# Patient Record
Sex: Female | Born: 1950
Health system: Southern US, Community
[De-identification: ages and names within clinical notes are randomized; demographics above are authoritative.]

## PROBLEM LIST (undated history)

## (undated) DIAGNOSIS — I10 Essential (primary) hypertension: Secondary | ICD-10-CM

## (undated) DIAGNOSIS — E039 Hypothyroidism, unspecified: Secondary | ICD-10-CM

## (undated) DIAGNOSIS — M1990 Unspecified osteoarthritis, unspecified site: Secondary | ICD-10-CM

## (undated) DIAGNOSIS — K219 Gastro-esophageal reflux disease without esophagitis: Secondary | ICD-10-CM

## (undated) HISTORY — PX: OVARIAN CYST REMOVAL: SHX89

---

## 2009-12-30 ENCOUNTER — Ambulatory Visit: Payer: Self-pay | Admitting: Diagnostic Radiology

## 2009-12-30 ENCOUNTER — Emergency Department (HOSPITAL_BASED_OUTPATIENT_CLINIC_OR_DEPARTMENT_OTHER): Admission: EM | Admit: 2009-12-30 | Discharge: 2009-12-30 | Payer: Self-pay | Admitting: Emergency Medicine

## 2012-11-24 ENCOUNTER — Encounter (HOSPITAL_BASED_OUTPATIENT_CLINIC_OR_DEPARTMENT_OTHER): Payer: Self-pay | Admitting: Emergency Medicine

## 2012-11-24 ENCOUNTER — Emergency Department (HOSPITAL_BASED_OUTPATIENT_CLINIC_OR_DEPARTMENT_OTHER)
Admission: EM | Admit: 2012-11-24 | Discharge: 2012-11-24 | Disposition: A | Discharge status: Home / Self Care | Payer: BC Managed Care – PPO | Attending: Emergency Medicine | Admitting: Emergency Medicine

## 2012-11-24 DIAGNOSIS — K219 Gastro-esophageal reflux disease without esophagitis: Secondary | ICD-10-CM | POA: Insufficient documentation

## 2012-11-24 DIAGNOSIS — IMO0002 Reserved for concepts with insufficient information to code with codable children: Secondary | ICD-10-CM | POA: Insufficient documentation

## 2012-11-24 DIAGNOSIS — Y929 Unspecified place or not applicable: Secondary | ICD-10-CM | POA: Insufficient documentation

## 2012-11-24 DIAGNOSIS — Z7982 Long term (current) use of aspirin: Secondary | ICD-10-CM | POA: Insufficient documentation

## 2012-11-24 DIAGNOSIS — I10 Essential (primary) hypertension: Secondary | ICD-10-CM | POA: Insufficient documentation

## 2012-11-24 DIAGNOSIS — E039 Hypothyroidism, unspecified: Secondary | ICD-10-CM | POA: Insufficient documentation

## 2012-11-24 DIAGNOSIS — Z79899 Other long term (current) drug therapy: Secondary | ICD-10-CM | POA: Insufficient documentation

## 2012-11-24 DIAGNOSIS — Z8739 Personal history of other diseases of the musculoskeletal system and connective tissue: Secondary | ICD-10-CM | POA: Insufficient documentation

## 2012-11-24 DIAGNOSIS — T189XXA Foreign body of alimentary tract, part unspecified, initial encounter: Secondary | ICD-10-CM

## 2012-11-24 DIAGNOSIS — Y9389 Activity, other specified: Secondary | ICD-10-CM | POA: Insufficient documentation

## 2012-11-24 HISTORY — DX: Unspecified osteoarthritis, unspecified site: M19.90

## 2012-11-24 HISTORY — DX: Hypothyroidism, unspecified: E03.9

## 2012-11-24 HISTORY — DX: Gastro-esophageal reflux disease without esophagitis: K21.9

## 2012-11-24 HISTORY — DX: Essential (primary) hypertension: I10

## 2012-11-24 NOTE — ED Provider Notes (Signed)
History     CSN: [id number]  Arrival date & time 11/24/12  0955   First MD Initiated Contact with Patient 11/24/12 1039      Chief Complaint  Patient presents with  . Swallowed Foreign Body    (Consider location/radiation/quality/duration/timing/severity/associated sxs/prior treatment) Patient is a 62 y.o. female presenting with foreign body swallowed. The history is provided by the patient (The patient swallowed a wrapped calcium chew and is concerned regardign the foil wrapping.  She has no other complaints. ). No language interpreter was used.  Swallowed Foreign Body This is a new problem. The current episode started less than 1 hour ago. The problem occurs constantly. The problem has not changed since onset.Pertinent negatives include no chest pain, no abdominal pain and no shortness of breath. Nothing aggravates the symptoms. Nothing relieves the symptoms. She has tried nothing for the symptoms. The treatment provided no relief.    Past Medical History  Diagnosis Date  . Hypertension   . Arthritis   . GERD (gastroesophageal reflux disease)   . Hypothyroid     No past surgical history on file.  No family history on file.  History  Substance Use Topics  . Smoking status: Not on file  . Smokeless tobacco: Not on file  . Alcohol Use: Not on file    OB History   Grav Para Term Preterm Abortions TAB SAB Ect Mult Living                  Review of Systems  Respiratory: Negative for shortness of breath.   Cardiovascular: Negative for chest pain.  Gastrointestinal: Negative for abdominal pain.  All other systems reviewed and are negative.    Allergies  Caine-1 and Penicillins  Home Medications   Current Outpatient Rx  Name  Route  Sig  Dispense  Refill  . aspirin 81 MG tablet   Oral   Take 81 mg by mouth daily.         . Calcium-Vitamin D-Vitamin K (VIACTIV) 500-500-40 MG-UNT-MCG CHEW   Oral   Chew by mouth.         Marland Kitchen ibuprofen (ADVIL,MOTRIN) 800 MG  tablet   Oral   Take 800 mg by mouth every 8 (eight) hours as needed for pain.         Marland Kitchen levothyroxine (SYNTHROID, LEVOTHROID) 100 MCG tablet   Oral   Take 100 mcg by mouth daily before breakfast.         . lisinopril (PRINIVIL,ZESTRIL) 10 MG tablet   Oral   Take 10 mg by mouth daily.         Marland Kitchen omeprazole (PRILOSEC) 10 MG capsule   Oral   Take 10 mg by mouth daily.         Marland Kitchen triamterene-hydrochlorothiazide (MAXZIDE-25) 37.5-25 MG per tablet   Oral   Take 1 tablet by mouth daily.           BP 136/83  Pulse 78  Temp(Src) 97.7 F (36.5 C) (Oral)  Resp 16  Ht 5\' 8"  (1.727 m)  Wt 258 lb (117.028 kg)  BMI 39.24 kg/m2  SpO2 99%  Physical Exam  Nursing note and vitals reviewed. Constitutional: She is oriented to person, place, and time. She appears well-developed and well-nourished.  HENT:  Head: Normocephalic.  Mouth/Throat: Oropharynx is clear and moist.  Neck: Normal range of motion. Neck supple.  Cardiovascular: Normal rate.   Pulmonary/Chest: Effort normal and breath sounds normal.  Abdominal: Soft.  Neurological: She is  alert and oriented to person, place, and time.  Skin: Skin is warm and dry.  Psychiatric: She has a normal mood and affect.    ED Course  Procedures (including critical care time)  Labs Reviewed - No data to display No results found.   No diagnosis found.    MDM  Patient with swallowed fb without concern for esophageal obstruction.  Patient advised return precautions.         Hilario Quarry, MD 11/24/12 1046

## 2012-11-24 NOTE — ED Notes (Signed)
Pt accidentally swallowed a viactiv supplement with the wrapper intact.  Denies any pain or nausea currently.

## 2012-11-24 NOTE — ED Notes (Signed)
Called poison control regarding ingestion of viactiv.  Poison control stated that there is no concern regarding the wrapper and that the patient would digest the paper and expell it through her stool.

## 2013-04-21 ENCOUNTER — Encounter (HOSPITAL_BASED_OUTPATIENT_CLINIC_OR_DEPARTMENT_OTHER): Payer: Self-pay

## 2013-04-21 ENCOUNTER — Emergency Department (HOSPITAL_BASED_OUTPATIENT_CLINIC_OR_DEPARTMENT_OTHER)
Admission: EM | Admit: 2013-04-21 | Discharge: 2013-04-21 | Disposition: A | Discharge status: Home / Self Care | Payer: BC Managed Care – PPO | Attending: Emergency Medicine | Admitting: Emergency Medicine

## 2013-04-21 DIAGNOSIS — S61219A Laceration without foreign body of unspecified finger without damage to nail, initial encounter: Secondary | ICD-10-CM

## 2013-04-21 DIAGNOSIS — M129 Arthropathy, unspecified: Secondary | ICD-10-CM | POA: Insufficient documentation

## 2013-04-21 DIAGNOSIS — Z880 Allergy status to penicillin: Secondary | ICD-10-CM | POA: Insufficient documentation

## 2013-04-21 DIAGNOSIS — I10 Essential (primary) hypertension: Secondary | ICD-10-CM | POA: Insufficient documentation

## 2013-04-21 DIAGNOSIS — E039 Hypothyroidism, unspecified: Secondary | ICD-10-CM | POA: Insufficient documentation

## 2013-04-21 DIAGNOSIS — Y9389 Activity, other specified: Secondary | ICD-10-CM | POA: Insufficient documentation

## 2013-04-21 DIAGNOSIS — Y929 Unspecified place or not applicable: Secondary | ICD-10-CM | POA: Insufficient documentation

## 2013-04-21 DIAGNOSIS — K219 Gastro-esophageal reflux disease without esophagitis: Secondary | ICD-10-CM | POA: Insufficient documentation

## 2013-04-21 DIAGNOSIS — W269XXA Contact with unspecified sharp object(s), initial encounter: Secondary | ICD-10-CM | POA: Insufficient documentation

## 2013-04-21 DIAGNOSIS — S61209A Unspecified open wound of unspecified finger without damage to nail, initial encounter: Secondary | ICD-10-CM | POA: Insufficient documentation

## 2013-04-21 DIAGNOSIS — Z7982 Long term (current) use of aspirin: Secondary | ICD-10-CM | POA: Insufficient documentation

## 2013-04-21 DIAGNOSIS — Z79899 Other long term (current) drug therapy: Secondary | ICD-10-CM | POA: Insufficient documentation

## 2013-04-21 NOTE — ED Notes (Signed)
Laceration to right index finger

## 2013-04-21 NOTE — ED Provider Notes (Signed)
CSN: [id number]     Arrival date & time 04/21/13  1747 History  This chart was scribed for Hilario Quarry, MD by Quintella Reichert, ED scribe.  This patient was seen in room MH04/MH04 and the patient's care was started at 7:50 PM.   Chief Complaint  Patient presents with  . Extremity Laceration    The history is provided by the patient. No language interpreter was used.    HPI Comments: Kaitlin Nolan is a 62 y.o. female with h/o HTN, hypothyroidism and arthritis who presents to the Emergency Department complaining of a laceration to her right index finger sustained 2 hours ago.  Pt states she was reaching into a section of her car that she couldn't see when her finger was cut on something sharp.  She does not know what the object was.  The area began bleeding immediately.  She did not clean the wound.  She has been keeping the area wrapped.  Last tetanus vaccination was within the past year.  She denies h/o DM.  She medicates regularly with Synthroid, Maxzide, lisinopril, aspirin, and ibuprofen.  PCP is Dr. Gildardo Griffes at [facility]   Past Medical History  Diagnosis Date  . Hypertension   . Arthritis   . GERD (gastroesophageal reflux disease)   . Hypothyroid     History reviewed. No pertinent past surgical history.   No family history on file.    History  Substance Use Topics  . Smoking status: Never Smoker   . Smokeless tobacco: Not on file  . Alcohol Use: No   OB History   Grav Para Term Preterm Abortions TAB SAB Ect Mult Living                  Review of Systems  Skin: Positive for wound.  All other systems reviewed and are negative.     Allergies  Caine-1 and Penicillins  Home Medications   Current Outpatient Rx  Name  Route  Sig  Dispense  Refill  . aspirin 81 MG tablet   Oral   Take 81 mg by mouth daily.         . Calcium-Vitamin D-Vitamin K (VIACTIV) 500-500-40 MG-UNT-MCG CHEW   Oral   Chew by mouth.         Marland Kitchen ibuprofen (ADVIL,MOTRIN)  800 MG tablet   Oral   Take 800 mg by mouth every 8 (eight) hours as needed for pain.         Marland Kitchen levothyroxine (SYNTHROID, LEVOTHROID) 100 MCG tablet   Oral   Take 100 mcg by mouth daily before breakfast.         . lisinopril (PRINIVIL,ZESTRIL) 10 MG tablet   Oral   Take 10 mg by mouth daily.         Marland Kitchen omeprazole (PRILOSEC) 10 MG capsule   Oral   Take 10 mg by mouth daily.         Marland Kitchen triamterene-hydrochlorothiazide (MAXZIDE-25) 37.5-25 MG per tablet   Oral   Take 1 tablet by mouth daily.          BP 150/82  Pulse 83  Temp(Src) 98.4 F (36.9 C) (Oral)  Resp 18  Ht 5\' 6"  (1.676 m)  Wt 260 lb (117.935 kg)  BMI 41.99 kg/m2  SpO2 100%  Physical Exam  Nursing note and vitals reviewed. Constitutional: She is oriented to person, place, and time. She appears well-developed and well-nourished. No distress.  HENT:  Head: Normocephalic and atraumatic.  Eyes: EOM  are normal.  Neck: Neck supple. No tracheal deviation present.  Cardiovascular: Normal rate.   Pulmonary/Chest: Effort normal. No respiratory distress.  Musculoskeletal: Normal range of motion.  Full active ROM to right index finger  Neurological: She is alert and oriented to person, place, and time.  Full 2-point discrimination to right index finger distal to injury  Skin: Skin is warm and dry.  2-cm laceration to palmar aspect of right index finger, distal to PIP joint.  When visually examined after anesthetized appeared to involve only subcutaneous fat, no apparent tendon involvement, no foreign bodies  Psychiatric: She has a normal mood and affect. Her behavior is normal.    ED Course  Procedures (including critical care time)  DIAGNOSTIC STUDIES: Oxygen Saturation is 100% on room air, normal by my interpretation.    COORDINATION OF CARE: 7:53 PM-Discussed treatment plan which includes laceration repair with pt at bedside and pt agreed to plan.   LACERATION REPAIR Performed by: Hilario Quarry,  MD Consent: Verbal consent obtained. Risks and benefits: risks, benefits and alternatives were discussed Patient identity confirmed: provided demographic data Time out performed prior to procedure Prepped and Draped in normal sterile fashion Wound explored Laceration Location: palmar aspect of right index finger Laceration Length: 2 cm No Foreign Bodies seen or palpated Anesthesia: local infiltration Local anesthetic: lidocaine 1% with epinephrine Anesthetic total: 2 ml Irrigation method: syringe Amount of cleaning: standard Number and type of sutures: 4, 4-0 prolene Technique: simple interrupted Tetanus UTD Patient tolerance: Patient tolerated the procedure well with no immediate complications.   Labs Review Labs Reviewed - No data to display  Imaging Review No results found.  MDM  I personally performed the services described in this documentation, which was scribed in my presence. The recorded information has been reviewed and considered.    Hilario Quarry, MD 04/21/13 [phone]

## 2013-04-21 NOTE — ED Notes (Signed)
MD at bedside. 

## 2013-04-21 NOTE — ED Notes (Signed)
Wound care provided, pt educated on s/s infection, bacitracin, gauze and conform applied.

## 2014-11-11 ENCOUNTER — Emergency Department (HOSPITAL_BASED_OUTPATIENT_CLINIC_OR_DEPARTMENT_OTHER)
Admission: EM | Admit: 2014-11-11 | Discharge: 2014-11-11 | Disposition: A | Discharge status: Home / Self Care | Payer: Medicare Other | Attending: Emergency Medicine | Admitting: Emergency Medicine

## 2014-11-11 ENCOUNTER — Emergency Department (HOSPITAL_BASED_OUTPATIENT_CLINIC_OR_DEPARTMENT_OTHER): Payer: Medicare Other

## 2014-11-11 ENCOUNTER — Other Ambulatory Visit: Payer: Self-pay

## 2014-11-11 ENCOUNTER — Encounter (HOSPITAL_BASED_OUTPATIENT_CLINIC_OR_DEPARTMENT_OTHER): Payer: Self-pay

## 2014-11-11 DIAGNOSIS — E039 Hypothyroidism, unspecified: Secondary | ICD-10-CM | POA: Diagnosis not present

## 2014-11-11 DIAGNOSIS — Z7982 Long term (current) use of aspirin: Secondary | ICD-10-CM | POA: Diagnosis not present

## 2014-11-11 DIAGNOSIS — R002 Palpitations: Secondary | ICD-10-CM | POA: Diagnosis present

## 2014-11-11 DIAGNOSIS — Z79899 Other long term (current) drug therapy: Secondary | ICD-10-CM | POA: Diagnosis not present

## 2014-11-11 DIAGNOSIS — Z9889 Other specified postprocedural states: Secondary | ICD-10-CM | POA: Insufficient documentation

## 2014-11-11 DIAGNOSIS — R079 Chest pain, unspecified: Secondary | ICD-10-CM | POA: Diagnosis not present

## 2014-11-11 DIAGNOSIS — K219 Gastro-esophageal reflux disease without esophagitis: Secondary | ICD-10-CM | POA: Insufficient documentation

## 2014-11-11 DIAGNOSIS — I10 Essential (primary) hypertension: Secondary | ICD-10-CM | POA: Insufficient documentation

## 2014-11-11 DIAGNOSIS — M1990 Unspecified osteoarthritis, unspecified site: Secondary | ICD-10-CM | POA: Diagnosis not present

## 2014-11-11 LAB — CBC WITH DIFFERENTIAL/PLATELET
Basophils Absolute: 0 10*3/uL (ref 0.0–0.1)
Basophils Relative: 0 % (ref 0–1)
EOS PCT: 1 % (ref 0–5)
Eosinophils Absolute: 0.1 10*3/uL (ref 0.0–0.7)
HCT: 39.5 % (ref 36.0–46.0)
HEMOGLOBIN: 12.7 g/dL (ref 12.0–15.0)
LYMPHS ABS: 2.1 10*3/uL (ref 0.7–4.0)
LYMPHS PCT: 24 % (ref 12–46)
MCH: 29.3 pg (ref 26.0–34.0)
MCHC: 32.2 g/dL (ref 30.0–36.0)
MCV: 91.2 fL (ref 78.0–100.0)
MONO ABS: 0.4 10*3/uL (ref 0.1–1.0)
MONOS PCT: 5 % (ref 3–12)
Neutro Abs: 6 10*3/uL (ref 1.7–7.7)
Neutrophils Relative %: 70 % (ref 43–77)
Platelets: 214 10*3/uL (ref 150–400)
RBC: 4.33 MIL/uL (ref 3.87–5.11)
RDW: 13.9 % (ref 11.5–15.5)
WBC: 8.6 10*3/uL (ref 4.0–10.5)

## 2014-11-11 LAB — COMPREHENSIVE METABOLIC PANEL
ALK PHOS: 40 U/L (ref 39–117)
ALT: 24 U/L (ref 0–35)
AST: 21 U/L (ref 0–37)
Albumin: 4.2 g/dL (ref 3.5–5.2)
Anion gap: 7 (ref 5–15)
BUN: 19 mg/dL (ref 6–23)
CALCIUM: 9.4 mg/dL (ref 8.4–10.5)
CO2: 31 mmol/L (ref 19–32)
Chloride: 100 mmol/L (ref 96–112)
Creatinine, Ser: 0.93 mg/dL (ref 0.50–1.10)
GFR, EST AFRICAN AMERICAN: 73 mL/min — AB (ref >90)
GFR, EST NON AFRICAN AMERICAN: 63 mL/min — AB (ref >90)
GLUCOSE: 124 mg/dL — AB (ref 70–99)
Potassium: 3.7 mmol/L (ref 3.5–5.1)
Sodium: 138 mmol/L (ref 135–145)
Total Bilirubin: 0.7 mg/dL (ref 0.3–1.2)
Total Protein: 7.3 g/dL (ref 6.0–8.3)

## 2014-11-11 LAB — TROPONIN I

## 2014-11-11 MED ORDER — ASPIRIN 81 MG PO CHEW
324.0000 mg | CHEWABLE_TABLET | Freq: Once | ORAL | Status: AC
  Administered 2014-11-11: 324 mg via ORAL
  Filled 2014-11-11: qty 4

## 2014-11-11 NOTE — ED Provider Notes (Signed)
CSN: [id number]     Arrival date & time 11/11/14  1707 History   First MD Initiated Contact with Patient 11/11/14 1716     Chief Complaint  Patient presents with  . Palpitations     Patient is a 64 y.o. female presenting with palpitations. The history is provided by the patient. No language interpreter was used.  Palpitations  Kaitlin Nolan presents for evaluation of chest pain and palpitations. Around 1:30 this afternoon she was getting home from the grocery and developed palpitations and irregular/racing heart beat sensation. That sensation lasted about 30 minutes and following it she developed a tightness in her left chest, radiating to her axilla and left shoulder. She denies any fevers, coughing, shortness of breath, nausea, diaphoresis, abdominal pain. She has a history of high blood pressure and takes a baby aspirin daily. She has no known cardiac history. No history of DVT or PE, takes no oral estrogens. She reports increased fatigue over the last month. Symptoms are moderate, constant, improving. Chest tightness does not change with activity or movement. The tightness in her chest is gradually improving with time.  Past Medical History  Diagnosis Date  . Hypertension   . Arthritis   . GERD (gastroesophageal reflux disease)   . Hypothyroid    Past Surgical History  Procedure Laterality Date  . Cesarean section    . Ovarian cyst removal     No family history on file. History  Substance Use Topics  . Smoking status: Never Smoker   . Smokeless tobacco: Not on file  . Alcohol Use: Yes   OB History    No data available     Review of Systems  Cardiovascular: Positive for palpitations.  All other systems reviewed and are negative.     Allergies  Benzocaine; Ezetimibe-atorvastatin; Lovastatin; Pravastatin; Caine-1; and Penicillins  Home Medications   Prior to Admission medications   Medication Sig Start Date End Date Taking? Authorizing Provider  azelastine (ASTELIN)  0.1 % nasal spray Place into both nostrils 2 (two) times daily. Use in each nostril as directed   Yes Historical Provider, MD  EPINEPHrine 0.3 mg/0.3 mL IJ SOAJ injection Inject into the muscle once.   Yes Historical Provider, MD  fexofenadine (ALLEGRA) 180 MG tablet Take 180 mg by mouth daily.   Yes Historical Provider, MD  fluocinonide cream (LIDEX) 0.05 % Apply 1 application topically 2 (two) times daily.   Yes Historical Provider, MD  glucosamine-chondroitin 500-400 MG tablet Take 1 tablet by mouth 3 (three) times daily.   Yes Historical Provider, MD  psyllium (METAMUCIL) 58.6 % packet Take 1 packet by mouth daily.   Yes Historical Provider, MD  aspirin 81 MG tablet Take 81 mg by mouth daily.    Historical Provider, MD  Calcium-Vitamin D-Vitamin K (VIACTIV) 500-500-40 MG-UNT-MCG CHEW Chew by mouth.    Historical Provider, MD  ibuprofen (ADVIL,MOTRIN) 800 MG tablet Take 800 mg by mouth every 8 (eight) hours as needed for pain.    Historical Provider, MD  levothyroxine (SYNTHROID, LEVOTHROID) 100 MCG tablet Take 88 mcg by mouth daily before breakfast.     Historical Provider, MD  lisinopril (PRINIVIL,ZESTRIL) 10 MG tablet Take 2.5 mg by mouth daily.     Historical Provider, MD  omeprazole (PRILOSEC) 10 MG capsule Take 10 mg by mouth daily.    Historical Provider, MD  triamterene-hydrochlorothiazide (MAXZIDE-25) 37.5-25 MG per tablet Take 1 tablet by mouth daily.    Historical Provider, MD   BP 151/72 mmHg  Pulse 80  Temp(Src) 98.3 F (36.8 C) (Oral)  Resp 20  Ht  (1.702 m)  Wt 264 lb (119.75 kg)  BMI 41.34 kg/m2  SpO2 98% Physical Exam  Constitutional: She is oriented to person, place, and time. She appears well-developed and well-nourished.  HENT:  Head: Normocephalic and atraumatic.  Cardiovascular: Normal rate and regular rhythm.   No murmur heard. Pulmonary/Chest: Effort normal and breath sounds normal. No respiratory distress.  Mild left chest wall tenderness to palpation  but patient states this is different than the chest tightness that she is experiencing.  Abdominal: Soft. There is no tenderness. There is no rebound and no guarding.  Musculoskeletal: She exhibits no edema or tenderness.  Neurological: She is alert and oriented to person, place, and time.  Skin: Skin is warm and dry.  Psychiatric: She has a normal mood and affect. Her behavior is normal.  Nursing note and vitals reviewed.   ED Course  Procedures (including critical care time) Labs Review Labs Reviewed  COMPREHENSIVE METABOLIC PANEL - Abnormal; Notable for the following:    Glucose, Bld 124 (*)    GFR calc non Af Amer 63 (*)    GFR calc Af Amer 73 (*)    All other components within normal limits  CBC WITH DIFFERENTIAL/PLATELET  TROPONIN I  TROPONIN I    Imaging Review No results found.   EKG Interpretation   Date/Time:  Wednesday November 11 2014 17:14:58 EDT Ventricular Rate:  79 PR Interval:  164 QRS Duration: 82 QT Interval:  380 QTC Calculation: 435 R Axis:   -13 Text Interpretation:  Normal sinus rhythm Low voltage QRS Cannot rule out  Anterior infarct , age undetermined Abnormal ECG Confirmed by Kaitlin Nolan  [phone]) on 11/11/2014 5:21:43 PM      MDM   Final diagnoses:  Palpitations  Chest pain, unspecified chest pain type    Patient here for evaluation of palpitations and chest pain. Chest pain resolved on recheck in the ED. EKG without ischemic changes. Clinical picture is not consistent with ACS, PE. Discussed with patient option of admission for stress testing versus close outpatient follow-up for stress testing and patient prefers outpatient follow-up. Discussed PCP follow-up as well as cardiology follow-up and return precautions.    Tilden Fossa, MD 11/12/14 [phone]

## 2014-11-11 NOTE — Discharge Instructions (Signed)
Chest Pain (Nonspecific) °It is often hard to give a specific diagnosis for the cause of chest pain. There is always a chance that your pain could be related to something serious, such as a heart attack or a blood clot in the lungs. You need to follow up with your health care provider for further evaluation. °CAUSES  °· Heartburn. °· Pneumonia or bronchitis. °· Anxiety or stress. °· Inflammation around your heart (pericarditis) or lung (pleuritis or pleurisy). °· A blood clot in the lung. °· A collapsed lung (pneumothorax). It can develop suddenly on its own (spontaneous pneumothorax) or from trauma to the chest. °· Shingles infection (herpes zoster virus). °The chest wall is composed of bones, muscles, and cartilage. Any of these can be the source of the pain. °· The bones can be bruised by injury. °· The muscles or cartilage can be strained by coughing or overwork. °· The cartilage can be affected by inflammation and become sore (costochondritis). °DIAGNOSIS  °Lab tests or other studies may be needed to find the cause of your pain. Your health care provider may have you take a test called an ambulatory electrocardiogram (ECG). An ECG records your heartbeat patterns over a 24-hour period. You may also have other tests, such as: °· Transthoracic echocardiogram (TTE). During echocardiography, sound waves are used to evaluate how blood flows through your heart. °· Transesophageal echocardiogram (TEE). °· Cardiac monitoring. This allows your health care provider to monitor your heart rate and rhythm in real time. °· Holter monitor. This is a portable device that records your heartbeat and can help diagnose heart arrhythmias. It allows your health care provider to track your heart activity for several days, if needed. °· Stress tests by exercise or by giving medicine that makes the heart beat faster. °TREATMENT  °· Treatment depends on what may be causing your chest pain. Treatment may include: °· Acid blockers for  heartburn. °· Anti-inflammatory medicine. °· Pain medicine for inflammatory conditions. °· Antibiotics if an infection is present. °· You may be advised to change lifestyle habits. This includes stopping smoking and avoiding alcohol, caffeine, and chocolate. °· You may be advised to keep your head raised (elevated) when sleeping. This reduces the chance of acid going backward from your stomach into your esophagus. °Most of the time, nonspecific chest pain will improve within 2-3 days with rest and mild pain medicine.  °HOME CARE INSTRUCTIONS  °· If antibiotics were prescribed, take them as directed. Finish them even if you start to feel better. °· For the next few days, avoid physical activities that bring on chest pain. Continue physical activities as directed. °· Do not use any tobacco products, including cigarettes, chewing tobacco, or electronic cigarettes. °· Avoid drinking alcohol. °· Only take medicine as directed by your health care provider. °· Follow your health care provider's suggestions for further testing if your chest pain does not go away. °· Keep any follow-up appointments you made. If you do not go to an appointment, you could develop lasting (chronic) problems with pain. If there is any problem keeping an appointment, call to reschedule. °SEEK MEDICAL CARE IF:  °· Your chest pain does not go away, even after treatment. °· You have a rash with blisters on your chest. °· You have a fever. °SEEK IMMEDIATE MEDICAL CARE IF:  °· You have increased chest pain or pain that spreads to your arm, neck, jaw, back, or abdomen. °· You have shortness of breath. °· You have an increasing cough, or you cough   up blood. °· You have severe back or abdominal pain. °· You feel nauseous or vomit. °· You have severe weakness. °· You faint. °· You have chills. °This is an emergency. Do not wait to see if the pain will go away. Get medical help at once. Call your local emergency services (911 in [place] Do not drive  yourself to the hospital. °MAKE SURE YOU:  °· Understand these instructions. °· Will watch your condition. °· Will get help right away if you are not doing well or get worse. °Document Released: 04/19/2005 Document Revised: 07/15/2013 Document Reviewed: 02/13/2008 °[employer]® Patient Information ©2015 [employer]. This information is not intended to replace advice given to you by your health care provider. Make sure you discuss any questions you have with your health care provider. ° ° °Palpitations °A palpitation is the feeling that your heartbeat is irregular or is faster than normal. It may feel like your heart is fluttering or skipping a beat. Palpitations are usually not a serious problem. However, in some cases, you may need further medical evaluation. °CAUSES  °Palpitations can be caused by: °· Smoking. °· Caffeine or other stimulants, such as diet pills or energy drinks. °· Alcohol. °· Stress and anxiety. °· Strenuous physical activity. °· Fatigue. °· Certain medicines. °· Heart disease, especially if you have a history of irregular heart rhythms (arrhythmias), such as atrial fibrillation, atrial flutter, or supraventricular tachycardia. °· An improperly working pacemaker or defibrillator. °DIAGNOSIS  °To find the cause of your palpitations, your health care provider will take your medical history and perform a physical exam. Your health care provider may also have you take a test called an ambulatory electrocardiogram (ECG). An ECG records your heartbeat patterns over a 24-hour period. You may also have other tests, such as: °· Transthoracic echocardiogram (TTE). During echocardiography, sound waves are used to evaluate how blood flows through your heart. °· Transesophageal echocardiogram (TEE). °· Cardiac monitoring. This allows your health care provider to monitor your heart rate and rhythm in real time. °· Holter monitor. This is a portable device that records your heartbeat and can help diagnose heart  arrhythmias. It allows your health care provider to track your heart activity for several days, if needed. °· Stress tests by exercise or by giving medicine that makes the heart beat faster. °TREATMENT  °Treatment of palpitations depends on the cause of your symptoms and can vary greatly. Most cases of palpitations do not require any treatment other than time, relaxation, and monitoring your symptoms. Other causes, such as atrial fibrillation, atrial flutter, or supraventricular tachycardia, usually require further treatment. °HOME CARE INSTRUCTIONS  °· Avoid: °¨ Caffeinated coffee, tea, soft drinks, diet pills, and energy drinks. °¨ Chocolate. °¨ Alcohol. °· Stop smoking if you smoke. °· Reduce your stress and anxiety. Things that can help you relax include: °¨ A method of controlling things in your body, such as your heartbeats, with your mind (biofeedback). °¨ Yoga. °¨ Meditation. °¨ Physical activity such as swimming, jogging, or walking. °· Get plenty of rest and sleep. °SEEK MEDICAL CARE IF:  °· You continue to have a fast or irregular heartbeat beyond 24 hours. °· Your palpitations occur more often. °SEEK IMMEDIATE MEDICAL CARE IF: °· You have chest pain or shortness of breath. °· You have a severe headache. °· You feel dizzy or you faint. °MAKE SURE YOU: °· Understand these instructions. °· Will watch your condition. °· Will get help right away if you are not doing well or get worse. °  Document Released: 07/07/2000 Document Revised: 07/15/2013 Document Reviewed: 09/08/2011 °ExitCare® Patient Information ©2015 [employer]. This information is not intended to replace advice given to you by your health care provider. Make sure you discuss any questions you have with your health care provider. ° ° °

## 2014-11-11 NOTE — ED Notes (Signed)
Patient transported to X-ray 

## 2014-11-11 NOTE — ED Notes (Signed)
Pt returned from radiololgy

## 2014-11-11 NOTE — ED Notes (Signed)
Pt reports around 130 today she started having palpitation.  Reports last night left shoulder was hurting.  Pt reports at one point reports it felt like a shock in the left side of her chest.  Pt reports at this time it is described as a tightness. Pt denies SOB, dizzy but complained of being cold.  Denies n/v.

## 2014-12-02 ENCOUNTER — Encounter: Payer: Self-pay | Admitting: Cardiology

## 2014-12-14 ENCOUNTER — Ambulatory Visit (INDEPENDENT_AMBULATORY_CARE_PROVIDER_SITE_OTHER): Payer: Medicare Other | Admitting: Cardiology

## 2014-12-14 ENCOUNTER — Encounter: Payer: Self-pay | Admitting: Cardiology

## 2014-12-14 VITALS — BP 120/70 | HR 66 | Ht 67.0 in | Wt 261.2 lb

## 2014-12-14 DIAGNOSIS — R079 Chest pain, unspecified: Secondary | ICD-10-CM

## 2014-12-14 DIAGNOSIS — I10 Essential (primary) hypertension: Secondary | ICD-10-CM | POA: Diagnosis not present

## 2014-12-14 DIAGNOSIS — R002 Palpitations: Secondary | ICD-10-CM | POA: Diagnosis not present

## 2014-12-14 NOTE — Progress Notes (Signed)
Expand All Collapse All      Cardiology Office Note   Date: 12/14/2014   ID: Kaitlin LeaderVirginia Nolan, DOB 09/11/1950, MRN [id number]  PCP: No primary care provider on file. Cardiologist: Quintella ReichertURNER,Srinidhi Landers R, MD   Chief Complaint  Patient presents with  . New Evaluation    palpitations, chest pain     History of Present Illness: Kaitlin Nolan is a 64 y.o. female who presents for evaluation of palpitations and chest pain. She was recently seen in [facility] with complaints of palpitations with irregular heart beat. This started while watching TV. She thinks that she may have just eaten. She described it as a fluttering sensation that was fast. She was by herself and got very anxious. The sensation lasted a few minutes and was followed by pressure and tightness in her chest with radiation to her axilla. She denied any SOB, nausea or diaphoresis. She did feel slightly dizzy. She also complains of recent increased fatigue since the first of the year. This has improved with some weight loss. Workup in the ER was normal and EKG showed possible anterior infarct with low voltage otherwise normal.    Past Medical History  Diagnosis Date  . Hypertension   . Arthritis   . GERD (gastroesophageal reflux disease)   . Hypothyroid     Past Surgical History  Procedure Laterality Date  . Cesarean section    . Ovarian cyst removal       Current Outpatient Prescriptions  Medication Sig Dispense Refill  . aspirin 81 MG tablet Take 81 mg by mouth daily.    Marland Kitchen. azelastine (ASTELIN) 0.1 % nasal spray Place into both nostrils 2 (two) times daily. Use in each nostril as directed    . EPINEPHrine 0.3 mg/0.3 mL IJ SOAJ injection Inject into the muscle once.    . fexofenadine (ALLEGRA) 180 MG tablet Take 180 mg by mouth daily.    . fluocinonide cream (LIDEX) 0.05 % Apply 1 application topically 2 (two) times daily.      Marland Kitchen. glucosamine-chondroitin 500-400 MG tablet Take 1 tablet by mouth 3 (three) times daily.    Marland Kitchen. ibuprofen (ADVIL,MOTRIN) 800 MG tablet Take 800 mg by mouth every 8 (eight) hours as needed for pain.    Marland Kitchen. levothyroxine (SYNTHROID, LEVOTHROID) 88 MCG tablet Take 88 mcg by mouth daily.    Marland Kitchen. lisinopril (PRINIVIL,ZESTRIL) 2.5 MG tablet Take 2.5 tablets by mouth daily.    . Multiple Vitamins-Minerals (MULTIVITAMIN GUMMIES WOMENS PO) Take 2 Doses by mouth daily.    Marland Kitchen. omeprazole (PRILOSEC) 10 MG capsule Take 10 mg by mouth daily.    . psyllium (METAMUCIL) 58.6 % packet Take 1 packet by mouth daily.    Marland Kitchen. triamterene-hydrochlorothiazide (DYAZIDE) 37.5-25 MG per capsule Take 1 capsule by mouth daily.     No current facility-administered medications for this visit.    Allergies: Bee venom; Benzocaine; Ezetimibe-atorvastatin; Lovastatin; Pravastatin; Caine-1; and Penicillins    Social History: The patient  reports that she has never smoked. She does not have any smokeless tobacco history on file. She reports that she drinks alcohol. She reports that she does not use illicit drugs.   Family History: The patient's family history includes Glaucoma in her mother; Heart Problems in her father.    ROS: Please see the history of present illness. Otherwise, review of systems are positive for none. All other systems are reviewed and negative.   PHYSICAL EXAM: VS: BP 120/70 mmHg  Pulse 66  Ht 5\' 7"  (1.702 m)  Wt 261 lb 3.2 oz (118.48 kg)  BMI 40.90 kg/m2  SpO2 97% , BMI Body mass index is 40.9 kg/(m^2). GEN: Well nourished, well developed, in no acute distress  HEENT: normal  Neck: no JVD, carotid bruits, or masses Cardiac: RRR; no murmurs, rubs, or gallops,no edema  Respiratory: clear to auscultation bilaterally, normal work of breathing GI: soft, nontender, nondistended, + BS MS: no deformity or atrophy  Skin: warm and dry, no rash Neuro:  Strength and sensation are intact Psych: euthymic mood, full affect   EKG: EKG was not ordered today.    Recent Labs: 11/11/2014: ALT 24; BUN 19; Creatinine 0.93; Hemoglobin 12.7; Platelets 214; Potassium 3.7; Sodium 138    Lipid Panel  Labs (Brief)    No results found for: CHOL, TRIG, HDL, CHOLHDL, VLDL, LDLCALC, LDLDIRECT     Wt Readings from Last 3 Encounters:  12/14/14 261 lb 3.2 oz (118.48 kg)  11/11/14 264 lb (119.75 kg)  04/21/13 260 lb (117.935 kg)        ASSESSMENT AND PLAN:  1. Chest pain that occurred in setting of palpitations. EKG is nonischemic other than possible old anterior infarct. Her CRF include HTN, family history of CAD and post menopausal state. I will set her up for a stress myoview to rule out ischemia.  2. Palpitations - I will get an event monitor to assess further 3. HTN - controlled on ACE I and diuretic   Current medicines are reviewed at length with the patient today. The patient does not have concerns regarding medicines.  The following changes have been made: no change  Labs/ tests ordered today include: event monitor and stress myoview  No orders of the defined types were placed in this encounter.    Disposition: FU with me PRN pending results of studies  SignedQuintella Reichert, MD  12/14/2014 10:08 AM  [facility] [address] [place], [place], [place] [address] Phone: [phone]; Fax: [phone]

## 2014-12-14 NOTE — Consult Note (Signed)
Cardiology Office Note   Date:  12/14/2014   ID:  Kaitlin Nolan, DOB 06/13/1951, MRN [id number]  PCP:  No primary care provider on file.  Cardiologist:   Quintella ReichertURNER,TRACI R, MD   Chief Complaint  Patient presents with  . New Evaluation    palpitations, chest pain      History of Present Illness: Kaitlin Nolan is a 64 y.o. female who presents for evaluation of palpitations and chest pain.  [facility] was [facility] with complaints of palpitations with irregular heart beat.  This started while watching TV.  She thinks that she may have just eaten. She described it as a fluttering sensation that was fast.  She was by herself and got very anxious.  The sensation lasted a few minutes and was followed by pressure and tightness in her chest with radiation to her axilla.  She denied any SOB, nausea or diaphoresis.  She did feel slightly dizzy. She also complains of recent increased fatigue since the first of the year. This has improved with some weight loss. Workup in the ER was normal and EKG showed possible anterior infarct with low voltage otherwise normal.    Past Medical History  Diagnosis Date  . Hypertension   . Arthritis   . GERD (gastroesophageal reflux disease)   . Hypothyroid     Past Surgical History  Procedure Laterality Date  . Cesarean section    . Ovarian cyst removal       Current Outpatient Prescriptions  Medication Sig Dispense Refill  . aspirin 81 MG tablet Take 81 mg by mouth daily.    Marland Kitchen. azelastine (ASTELIN) 0.1 % nasal spray Place into both nostrils 2 (two) times daily. Use in each nostril as directed    . EPINEPHrine 0.3 mg/0.3 mL IJ SOAJ injection Inject into the muscle once.    . fexofenadine (ALLEGRA) 180 MG tablet Take 180 mg by mouth daily.    . fluocinonide cream (LIDEX) 0.05 % Apply 1 application topically 2 (two) times daily.    Marland Kitchen. glucosamine-chondroitin 500-400 MG tablet Take 1 tablet by mouth 3 (three) times daily.    Marland Kitchen. ibuprofen  (ADVIL,MOTRIN) 800 MG tablet Take 800 mg by mouth every 8 (eight) hours as needed for pain.    Marland Kitchen. levothyroxine (SYNTHROID, LEVOTHROID) 88 MCG tablet Take 88 mcg by mouth daily.    Marland Kitchen. lisinopril (PRINIVIL,ZESTRIL) 2.5 MG tablet Take 2.5 tablets by mouth daily.    . Multiple Vitamins-Minerals (MULTIVITAMIN GUMMIES WOMENS PO) Take 2 Doses by mouth daily.    Marland Kitchen. omeprazole (PRILOSEC) 10 MG capsule Take 10 mg by mouth daily.    . psyllium (METAMUCIL) 58.6 % packet Take 1 packet by mouth daily.    Marland Kitchen. triamterene-hydrochlorothiazide (DYAZIDE) 37.5-25 MG per capsule Take 1 capsule by mouth daily.     No current facility-administered medications for this visit.    Allergies:   Bee venom; Benzocaine; Ezetimibe-atorvastatin; Lovastatin; Pravastatin; Caine-1; and Penicillins    Social History:  The patient  reports that she has never smoked. She does not have any smokeless tobacco history on file. She reports that she drinks alcohol. She reports that she does not use illicit drugs.   Family History:  The patient's family history includes Glaucoma in her mother; Heart Problems in her father.    ROS:  Please see the history of present illness.   Otherwise, review of systems are positive for none.   All other systems are reviewed and negative.  PHYSICAL EXAM: VS:  BP 120/70 mmHg  Pulse 66  Ht  (1.702 m)  Wt 261 lb 3.2 oz (118.48 kg)  BMI 40.90 kg/m2  SpO2 97% , BMI Body mass index is 40.9 kg/(m^2). GEN: Well nourished, well developed, in no acute distress HEENT: normal Neck: no JVD, carotid bruits, or masses Cardiac: RRR; no murmurs, rubs, or gallops,no edema  Respiratory:  clear to auscultation bilaterally, normal work of breathing GI: soft, nontender, nondistended, + BS MS: no deformity or atrophy Skin: warm and dry, no rash Neuro:  Strength and sensation are intact Psych: euthymic mood, full affect   EKG:  EKG was not ordered today.    Recent Labs: 11/11/2014: ALT 24; BUN 19;  Creatinine 0.93; Hemoglobin 12.7; Platelets 214; Potassium 3.7; Sodium 138    Lipid Panel No results found for: CHOL, TRIG, HDL, CHOLHDL, VLDL, LDLCALC, LDLDIRECT    Wt Readings from Last 3 Encounters:  12/14/14 261 lb 3.2 oz (118.48 kg)  11/11/14 264 lb (119.75 kg)  04/21/13 260 lb (117.935 kg)        ASSESSMENT AND PLAN:  1.  Chest pain that occurred in setting of palpitations.  EKG is nonischemic other than possible old anterior infarct.  Her CRF include HTN, family history of CAD and post menopausal state.  I will set her up for a stress myoview to rule out ischemia.   2.  Palpitations - I will get an event monitor to assess further 3.  HTN - controlled on ACE I and diuretic   Current medicines are reviewed at length with the patient today.  The patient does not have concerns regarding medicines.  The following changes have been made:  no change  Labs/ tests ordered today include: event monitor and stress myoview  No orders of the defined types were placed in this encounter.     Disposition:   FU with me PRN pending results of studies  SignedQuintella Reichert, MD  12/14/2014 10:08 AM    [facility] [address] [place], [place], [place]  [address] Phone: [phone]; Fax: [phone]

## 2014-12-14 NOTE — Patient Instructions (Addendum)
Medication Instructions:  Your physician recommends that you continue on your current medications as directed. Please refer to the Current Medication list given to you today.   Labwork: None  Testing/Procedures: Dr. Mayford Knifeurner recommends you have a STRESS MYOVIEW.  Your physician has recommended that you wear an event monitor. Event monitors are medical devices that record the heart's electrical activity. Doctors most often us these monitors to diagnose arrhythmias. Arrhythmias are problems with the speed or rhythm of the heartbeat. The monitor is a small, portable device. You can wear one while you do your normal daily activities. This is usually used to diagnose what is causing palpitations/syncope (passing out).  Follow-Up: Your physician recommends that you schedule a follow-up appointment AS NEEDED with Dr. Mayford Knifeurner pending study results.  Any Other Special Instructions Will Be Listed Below (If Applicable).

## 2014-12-15 ENCOUNTER — Ambulatory Visit (INDEPENDENT_AMBULATORY_CARE_PROVIDER_SITE_OTHER): Payer: Medicare Other

## 2014-12-15 DIAGNOSIS — R002 Palpitations: Secondary | ICD-10-CM | POA: Diagnosis not present

## 2014-12-24 ENCOUNTER — Telehealth (HOSPITAL_COMMUNITY): Payer: Self-pay

## 2014-12-24 NOTE — Telephone Encounter (Signed)
Patient given detailed instructions per Myocardial Perfusion Study Information Sheet for test on 12-29-2014 at 11:30am. Patient verbalized understanding. Kaitlin Nolan, Kaitlin Nolan

## 2014-12-29 ENCOUNTER — Encounter: Payer: BC Managed Care – PPO | Admitting: Cardiology

## 2014-12-29 ENCOUNTER — Ambulatory Visit (HOSPITAL_COMMUNITY): Payer: Medicare Other | Attending: Internal Medicine

## 2014-12-29 DIAGNOSIS — I10 Essential (primary) hypertension: Secondary | ICD-10-CM | POA: Insufficient documentation

## 2014-12-29 DIAGNOSIS — R42 Dizziness and giddiness: Secondary | ICD-10-CM | POA: Diagnosis not present

## 2014-12-29 DIAGNOSIS — R079 Chest pain, unspecified: Secondary | ICD-10-CM | POA: Insufficient documentation

## 2014-12-29 DIAGNOSIS — R002 Palpitations: Secondary | ICD-10-CM | POA: Insufficient documentation

## 2014-12-29 MED ORDER — TECHNETIUM TC 99M SESTAMIBI GENERIC - CARDIOLITE
33.0000 | Freq: Once | INTRAVENOUS | Status: AC | PRN
  Administered 2014-12-29: 33 via INTRAVENOUS

## 2014-12-30 ENCOUNTER — Ambulatory Visit (HOSPITAL_COMMUNITY): Payer: Medicare Other | Attending: Cardiology

## 2014-12-30 LAB — MYOCARDIAL PERFUSION IMAGING
CHL CUP NUCLEAR SDS: 1
CHL CUP NUCLEAR SRS: 2
CHL CUP NUCLEAR SSS: 3
CHL CUP RESTING HR STRESS: 57 {beats}/min
CSEPED: 5 min
CSEPEDS: 6 s
CSEPEW: 7 METS
LHR: 0.31
LV sys vol: 65 mL
LVDIAVOL: 135 mL
MPHR: 155 {beats}/min
NUC STRESS EF: 52 %
Peak HR: 153 {beats}/min
Percent HR: 99 %
RPE: 17
TID: 0.95

## 2014-12-30 MED ORDER — TECHNETIUM TC 99M SESTAMIBI GENERIC - CARDIOLITE
33.0000 | Freq: Once | INTRAVENOUS | Status: AC | PRN
  Administered 2014-12-30: 33 via INTRAVENOUS

## 2014-12-31 ENCOUNTER — Encounter: Payer: Self-pay | Admitting: Cardiology

## 2015-01-18 ENCOUNTER — Ambulatory Visit: Payer: BC Managed Care – PPO | Admitting: Cardiology

## 2016-12-04 IMAGING — DX DG CHEST 2V
2 series · 2 of 2 positions shown · non-contrast
Comparison: None.

CLINICAL DATA: 64-year-old female with chest tightness and
palpitations

EXAM:
CHEST  2 VIEW

[chest pa]
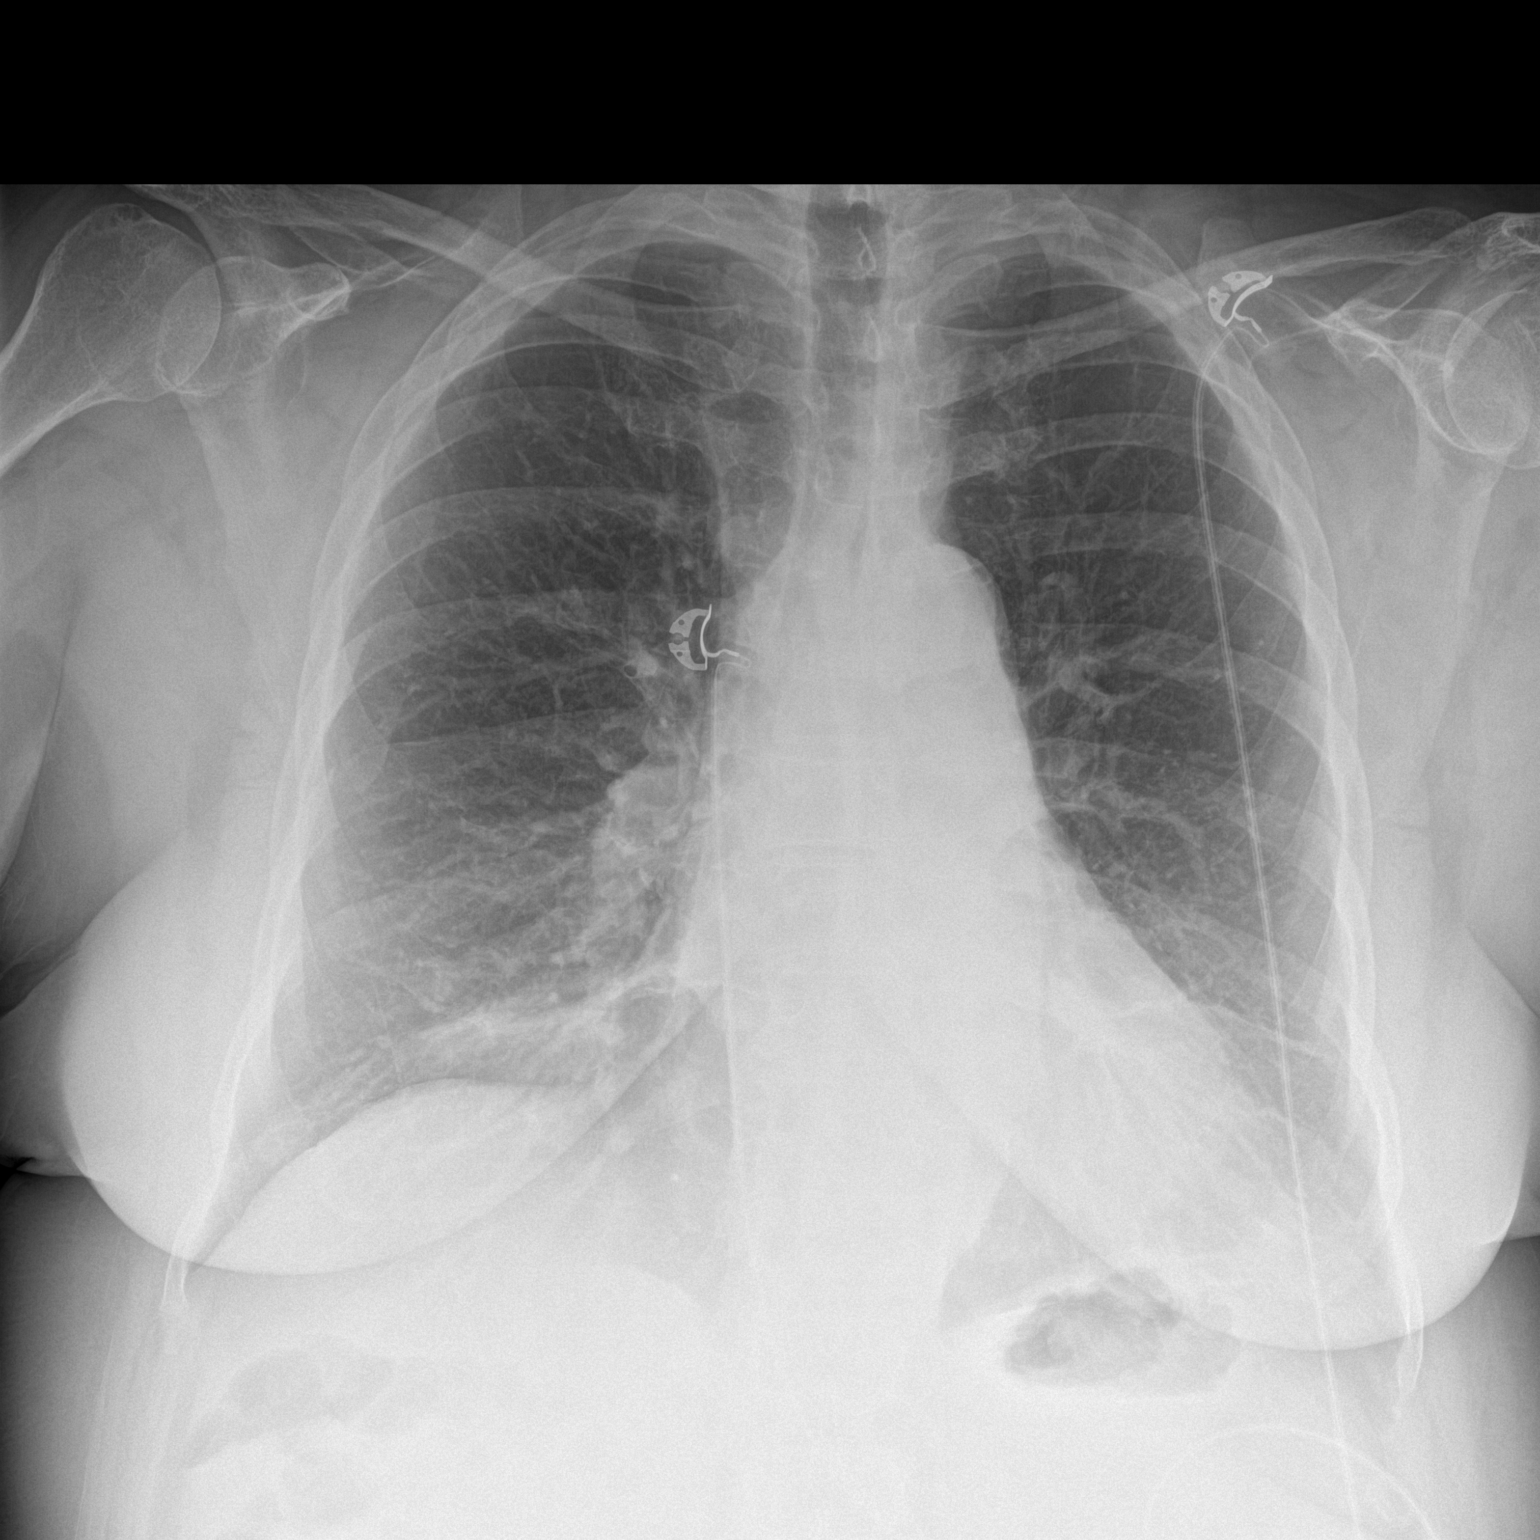

[chest lat]
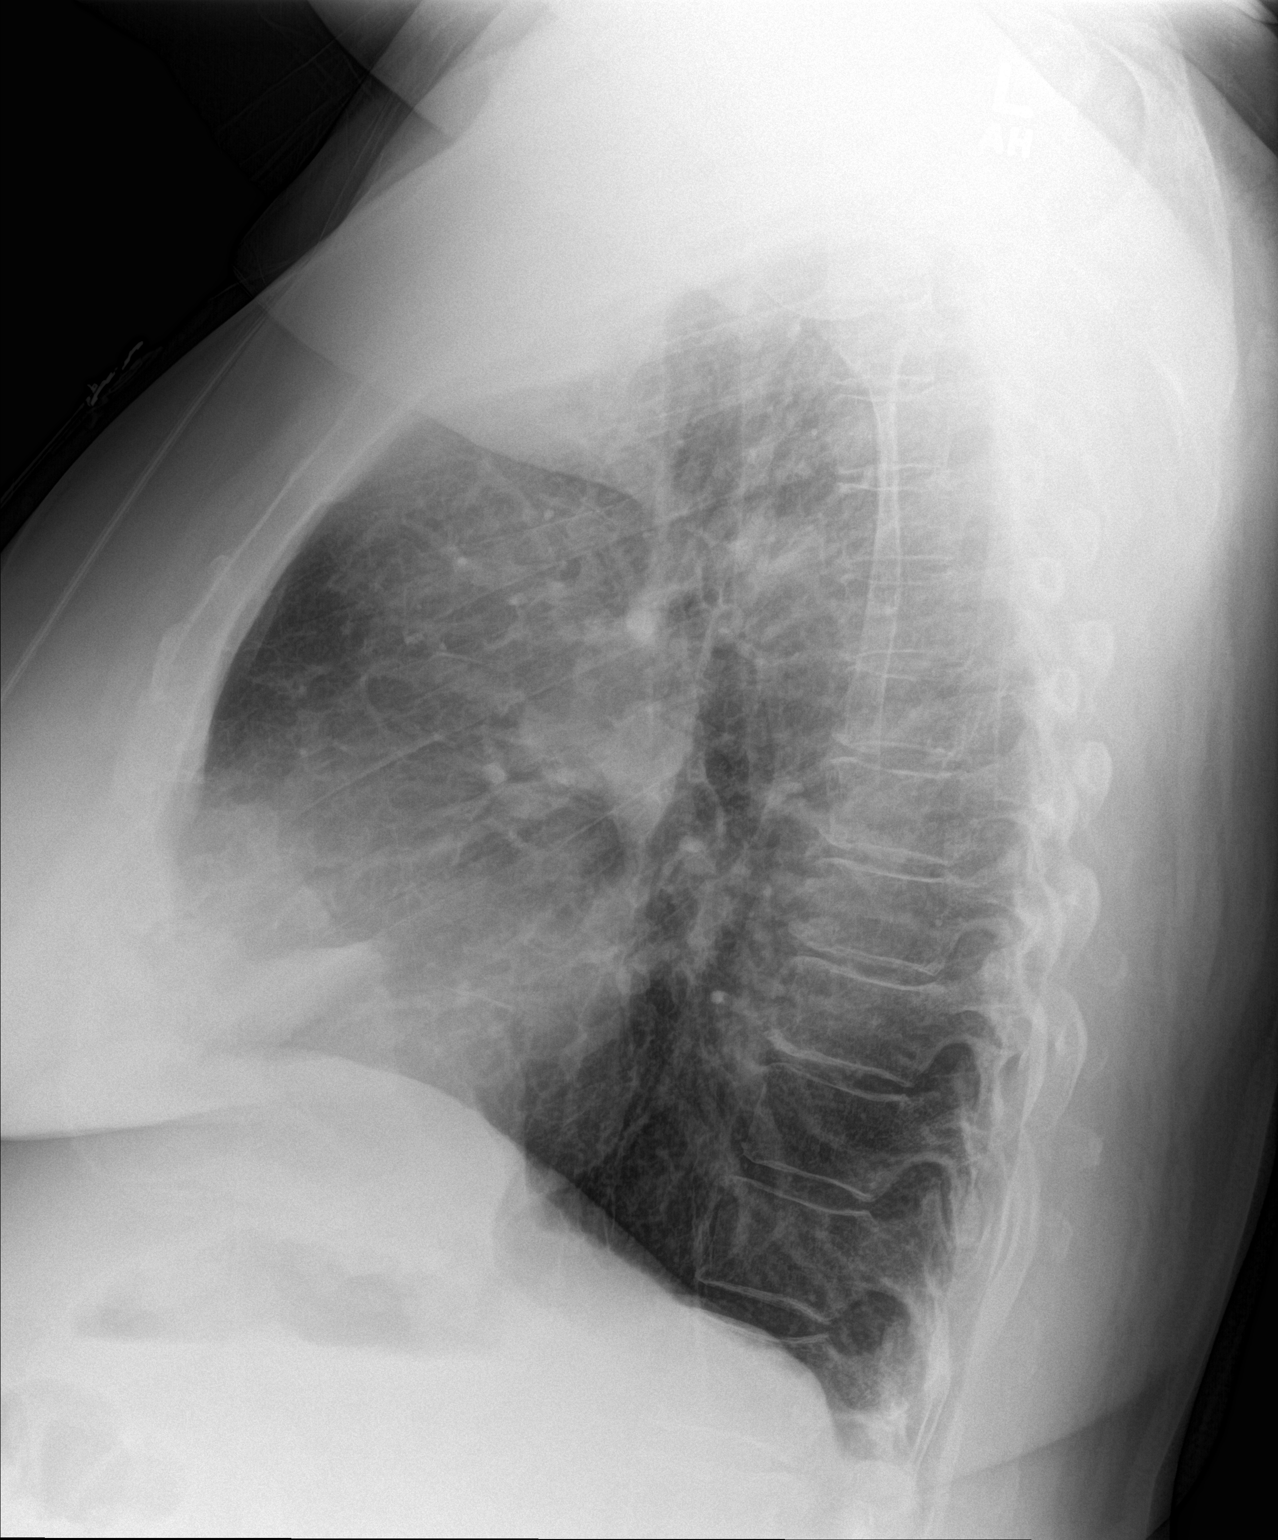

[2 of 2 positions shown; findings below may reference images not displayed]

FINDINGS: Cardiac and mediastinal contours are within normal limits. Linear
right middle lobe opacity most suggestive of atelectasis. No pleural
effusion or pneumothorax. Mild pulmonary hyper expansion and central
bronchitic change. No suspicious pulmonary nodule. No acute osseous
abnormality.
IMPRESSION: 1. Linear right middle lobe opacity most consistent with
atelectasis. Recommend repeat chest x-ray in 3- 4 weeks following
resolution of the acute symptoms to confirm resolution. If there is
persistent atelectasis, CT scan should then be considered to exclude
an obstructing central mass.
2. Mild hyperinflation and central bronchitic change suggests the
possibility of COPD.

## 2017-01-21 IMAGING — NM NM MYOCAR MULTI W/ SPECT
3 series · 18 of 18 positions shown · non-contrast
Comparison: none

[Series 1: stress · 6.51mm/px · 6 of 64 frames shown (1 of 2)]
[frame 6/64]
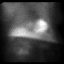
[frame 16/64]
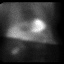
[frame 27/64]
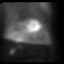
[frame 38/64]
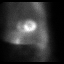
[frame 48/64]
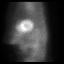
[frame 59/64]
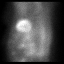

[Series 1: stress · 6.51mm/px · 6 of 487 frames shown (2 of 2)]
[frame 41/487  full-range]
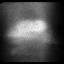
[frame 122/487  full-range]
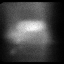
[frame 203/487  full-range]
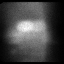
[frame 284/487  full-range]
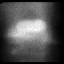
[frame 365/487  full-range]
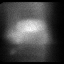
[frame 447/487  full-range]
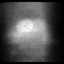

[Series 2: rest · 6.51mm/px · 6 of 64 frames shown]
[frame 6/64]
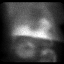
[frame 16/64]
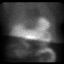
[frame 27/64]
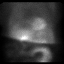
[frame 38/64]
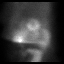
[frame 48/64]
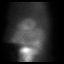
[frame 59/64]
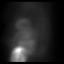

[18 of 18 positions shown; findings below may reference images not displayed]

Canned report from images found in remote index.

Refer to host system for actual result text.

## 2019-08-17 ENCOUNTER — Ambulatory Visit: Payer: Medicare PPO | Attending: Internal Medicine

## 2019-08-17 DIAGNOSIS — Z23 Encounter for immunization: Secondary | ICD-10-CM | POA: Insufficient documentation

## 2019-08-17 NOTE — Progress Notes (Signed)
   Covid-19 Vaccination Clinic  Name:  Mekaila Tarnow    MRN: [id number] DOB: 01/23/1951  08/17/2019  Ms. Berroa was observed post Covid-19 immunization for 15 minutes without incidence. She was provided with Vaccine Information Sheet and instruction to access the V-Safe system.   Ms. Sauseda was instructed to call 911 with any severe reactions post vaccine: Marland Kitchen Difficulty breathing  . Swelling of your face and throat  . A fast heartbeat  . A bad rash all over your body  . Dizziness and weakness    Immunizations Administered    Name Date Dose VIS Date Route   Pfizer COVID-19 Vaccine 08/17/2019 10:13 AM 0.3 mL 07/04/2019 Intramuscular   Manufacturer: [employer], [employer]   Lot: [id number]   NDC: [address]-0

## 2019-09-08 ENCOUNTER — Ambulatory Visit: Payer: Medicare PPO | Attending: Internal Medicine

## 2019-09-08 DIAGNOSIS — Z23 Encounter for immunization: Secondary | ICD-10-CM | POA: Insufficient documentation

## 2019-09-08 NOTE — Progress Notes (Signed)
   Covid-19 Vaccination Clinic  Name:  Kaitlin Nolan    MRN: [id number] DOB: Jun 23, 1951  09/08/2019  Ms. Kaitlin Nolan was observed post Covid-19 immunization for 15 minutes without incidence. She was provided with Vaccine Information Sheet and instruction to access the V-Safe system.   Ms. Kaitlin Nolan was instructed to call 911 with any severe reactions post vaccine: Marland Kitchen Difficulty breathing  . Swelling of your face and throat  . A fast heartbeat  . A bad rash all over your body  . Dizziness and weakness    Immunizations Administered    Name Date Dose VIS Date Route   Pfizer COVID-19 Vaccine 09/08/2019  8:18 AM 0.3 mL 07/04/2019 Intramuscular   Manufacturer: [employer], [employer]   Lot: [id number]   NDC: [address]-1

## 2020-05-14 ENCOUNTER — Other Ambulatory Visit: Payer: Self-pay

## 2020-05-14 ENCOUNTER — Other Ambulatory Visit (HOSPITAL_BASED_OUTPATIENT_CLINIC_OR_DEPARTMENT_OTHER): Payer: Self-pay | Admitting: Internal Medicine

## 2020-05-14 ENCOUNTER — Ambulatory Visit: Payer: Medicare PPO | Attending: Internal Medicine

## 2020-05-14 DIAGNOSIS — Z23 Encounter for immunization: Secondary | ICD-10-CM

## 2020-05-14 NOTE — Progress Notes (Signed)
   Covid-19 Vaccination Clinic  Name:  Kaitlin Nolan    MRN: [id number] DOB: September 26, 1950  05/14/2020  Kaitlin Nolan was observed post Covid-19 immunization for 15 minutes without incident. She was provided with Vaccine Information Sheet and instruction to access the V-Safe system.  Vaccinated by [facility]   Kaitlin Nolan was instructed to call 911 with any severe reactions post vaccine: Marland Kitchen Difficulty breathing  . Swelling of face and throat  . A fast heartbeat  . A bad rash all over body  . Dizziness and weakness

## 2020-05-20 MED FILL — PFIZER-BIONTECH COVID-19 VA: 30 | 1 days supply | Qty: 0 | Fill #0

## 2020-11-10 ENCOUNTER — Ambulatory Visit: Payer: Medicare PPO | Attending: Internal Medicine

## 2020-11-10 DIAGNOSIS — Z23 Encounter for immunization: Secondary | ICD-10-CM

## 2020-11-10 NOTE — Progress Notes (Signed)
   Covid-19 Vaccination Clinic  Name:  Leonela Kivi    MRN: [id number] DOB: 18-Sep-1950  11/10/2020  Ms. Winkels was observed post Covid-19 immunization for 15 minutes without incident. She was provided with Vaccine Information Sheet and instruction to access the V-Safe system.   Ms. Mischke was instructed to call 911 with any severe reactions post vaccine: Marland Kitchen Difficulty breathing  . Swelling of face and throat  . A fast heartbeat  . A bad rash all over body  . Dizziness and weakness   Immunizations Administered    Name Date Dose VIS Date Route   PFIZER Comrnaty(Gray TOP) Covid-19 Vaccine 11/10/2020 10:32 AM 0.3 mL 07/01/2020 Intramuscular   Manufacturer: [employer], [employer]   Lot: [id number]   NDC: [phone]

## 2020-11-12 ENCOUNTER — Other Ambulatory Visit (HOSPITAL_BASED_OUTPATIENT_CLINIC_OR_DEPARTMENT_OTHER): Payer: Self-pay

## 2020-11-12 MED ORDER — PFIZER-BIONT COVID-19 VAC-TRIS 30 MCG/0.3ML IM SUSP
INTRAMUSCULAR | 0 refills | Status: AC
  Filled 2020-11-12: qty 0.3, 1d supply, fill #0

## 2021-06-06 ENCOUNTER — Ambulatory Visit: Payer: Medicare PPO | Attending: Internal Medicine

## 2021-06-06 DIAGNOSIS — Z23 Encounter for immunization: Secondary | ICD-10-CM

## 2021-06-06 NOTE — Progress Notes (Signed)
   Covid-19 Vaccination Clinic  Name:  Kaitlin Nolan    MRN: [id number] DOB: 10/07/1950  06/06/2021  Kaitlin Nolan was observed post Covid-19 immunization for 15 minutes without incident. She was provided with Vaccine Information Sheet and instruction to access the V-Safe system.   Kaitlin Nolan was instructed to call 911 with any severe reactions post vaccine: Difficulty breathing  Swelling of face and throat  A fast heartbeat  A bad rash all over body  Dizziness and weakness   Immunizations Administered     Name Date Dose VIS Date Route   Pfizer Covid-19 Vaccine Bivalent Booster 06/06/2021 10:48 AM 0.3 mL 03/23/2021 Intramuscular   Manufacturer: [employer], [employer]   Lot: [id number]   NDC: [phone]

## 2021-06-24 ENCOUNTER — Other Ambulatory Visit (HOSPITAL_BASED_OUTPATIENT_CLINIC_OR_DEPARTMENT_OTHER): Payer: Self-pay

## 2021-06-24 MED ORDER — PFIZER COVID-19 VAC BIVALENT 30 MCG/0.3ML IM SUSP
INTRAMUSCULAR | 0 refills | Status: AC
  Filled 2021-06-24: qty 0.3, 1d supply, fill #0
